# Patient Record
Sex: Female | Born: 1970 | Hispanic: Yes | Marital: Married | Smoking: Never smoker
Health system: Southern US, Community
[De-identification: ages and names within clinical notes are randomized; demographics above are authoritative.]

## PROBLEM LIST (undated history)

## (undated) DIAGNOSIS — D649 Anemia, unspecified: Secondary | ICD-10-CM

## (undated) HISTORY — DX: Anemia, unspecified: D64.9

---

## 1999-07-18 ENCOUNTER — Inpatient Hospital Stay (HOSPITAL_BASED_OUTPATIENT_CLINIC_OR_DEPARTMENT_OTHER)
Admission: EM | Admit: 1999-07-18 | Disposition: A | Discharge status: Home / Self Care | Payer: Self-pay | Source: Ambulatory Visit

## 2010-05-03 ENCOUNTER — Emergency Department: Admit: 2010-05-03 | Payer: Self-pay | Source: Emergency Department | Admitting: Emergency Medicine

## 2010-05-03 LAB — URINALYSIS, REFLEX TO MICROSCOPIC EXAM IF INDICATED
Bilirubin, UA: NEGATIVE
Glucose, UA: 50 — AB
Ketones UA: NEGATIVE
Leukocyte Esterase, UA: NEGATIVE
Nitrite, UA: NEGATIVE
Protein, UR: >300
Specific Gravity UA POCT: 1.005 (ref 1.001–1.035)
Urine pH: 7 (ref 5.0–8.0)
Urobilinogen, UA: NORMAL mg/dL

## 2010-05-03 LAB — PT AND APTT F/C
PT INR: 1.1 (ref 0.9–1.1)
PT: 14 s (ref 12.6–15.0)
PTT: 30 s (ref 23–37)

## 2010-05-03 LAB — CBC AND DIFFERENTIAL
Baso(Absolute): 0.01 10*3/uL (ref 0.00–0.20)
Basophils: 0 % (ref 0–2)
Eosinophils Absolute: 0.13 10*3/uL (ref 0.00–0.70)
Eosinophils: 2 % (ref 0–5)
Hematocrit: 36.3 % — ABNORMAL LOW (ref 37.0–47.0)
Hgb: 12.3 g/dL (ref 12.0–16.0)
Immature Granulocytes Absolute: 0.02 10*3/uL
Immature Granulocytes: 0 % (ref 0–1)
Lymphocytes Absolute: 2.16 10*3/uL (ref 0.50–4.40)
Lymphocytes: 29 % (ref 15–41)
MCH: 29.6 pg (ref 28.0–32.0)
MCHC: 33.9 g/dL (ref 32.0–36.0)
MCV: 87.5 fL (ref 80.0–100.0)
MPV: 10.4 fL (ref 9.4–12.3)
Monocytes Absolute: 0.25 10*3/uL (ref 0.00–1.20)
Monocytes: 3 % (ref 0–11)
Neutrophils Absolute: 4.86 10*3/uL
Neutrophils: 65 % (ref 52–75)
Platelets: 294 10*3/uL (ref 140–400)
RBC: 4.15 10*6/uL — ABNORMAL LOW (ref 4.20–5.40)
RDW: 13 % (ref 12–15)
WBC: 7.43 10*3/uL (ref 3.50–10.80)

## 2010-05-03 LAB — BASIC METABOLIC PANEL
BUN: 14 mg/dL (ref 8–20)
CO2: 25 mEq/L (ref 21–30)
Calcium: 8 mg/dL — ABNORMAL LOW (ref 8.6–10.2)
Chloride: 111 mEq/L — ABNORMAL HIGH (ref 98–107)
Creatinine: 0.7 mg/dL (ref 0.6–1.5)
Glucose: 82 mg/dL (ref 70–100)
Potassium: 4.3 mEq/L (ref 3.6–5.0)
Sodium: 139 mEq/L (ref 136–146)

## 2010-05-03 LAB — GFR: EGFR: >60

## 2010-05-12 ENCOUNTER — Ambulatory Visit (HOSPITAL_BASED_OUTPATIENT_CLINIC_OR_DEPARTMENT_OTHER)
Admission: RE | Admit: 2010-05-12 | Disposition: A | Discharge status: Home / Self Care | Payer: Self-pay | Source: Ambulatory Visit | Admitting: Obstetrics and Gynecology

## 2010-05-12 ENCOUNTER — Ambulatory Visit: Payer: Self-pay

## 2011-09-18 NOTE — Op Note (Signed)
Christie Odom, Christie Odom      MRN:          [id number]      Account:      [id number]      Document ID:  [id number]      Procedure Date: 05/12/2010            Admit Date: 05/12/2010            Patient Location: [id number]      Patient Type: A            SURGEON: Jearld Lesch Loda Bialas MD      ASSISTANT:                  PREOPERATIVE DIAGNOSIS:      Abnormal uterine bleeding, fibroid uterus.            POSTOPERATIVE DIAGNOSIS:      Abnormal uterine bleeding, fibroid uterus.            TITLE OF PROCEDURE:      Hysteroscopy, D and C.            ANESTHESIA:      General.            ESTIMATED BLOOD LOSS:      Less than 10 mL.            DESCRIPTION OF PROCEDURE:      The patient was taken to the operating room, was given general anesthesia.      The patient was prepped and draped in the usual sterile fashion.  Direct      examination reveals the uterus to be approximately 6 weeks size.  Cervical      os was open.  The weighted speculum was placed and the anterior lip of the      cervix was grasped with long Allis forceps.  The hysteroscope was inserted      using saline solution.  There was a copious amount of old blood in the      uterus, and endometrial tissue.  The hysteroscope was removed.  It was      followed by insertion of the polyp forceps and then sharp curettage was      performed obtaining __small___ amount of old blood and endometrial tissue.  The      hysteroscope was reinserted.  The uterine cavity appears to be clean, with      no significant abnormalities.  The procedure was terminated.  The patient      was transferred to the recovery room in stable condition.                                    D:  05/12/2010 08:37 AM by Dr. Jearld Lesch. Oziel Beitler, MD [phone])      T:  05/12/2010 10:43 AM by [id number]                                         Page 1 of 2      Christie Odom, Christie Odom      MRN:          [id number]      Account:      [id number]      Document ID:  [id number]      Procedure Date: 05/12/2010  cc:                                    Page 2 of 2      Authenticated and Edited by Andree Coss, MD On 05/13/10 8:02:54 AM

## 2017-06-25 IMAGING — US US NON OB TRANSVAGINAL W LTD TA
1 series · 14 of 28 positions shown · non-contrast
Comparison: None at all

Ultrasound pelvis
INDICATION: Pelvic pain

[Series 1: us non ob transvaginal w ltd ta · 0.26mm/px · 14 of 28 slices shown]
[im 2/28]
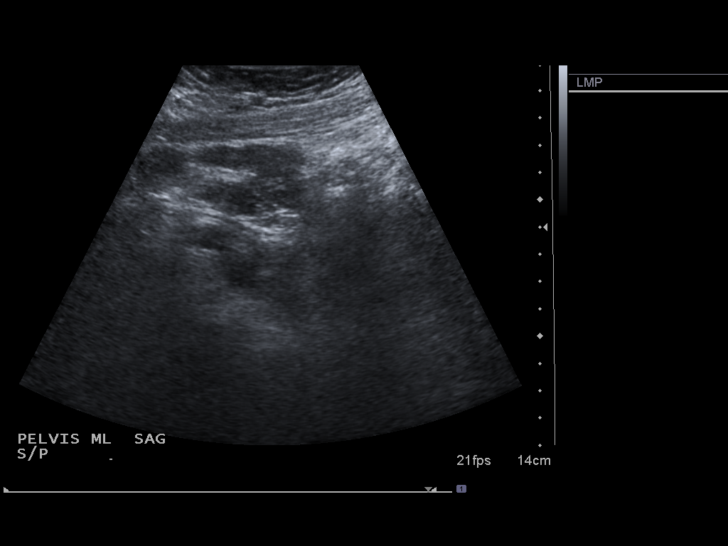
[im 4/28]
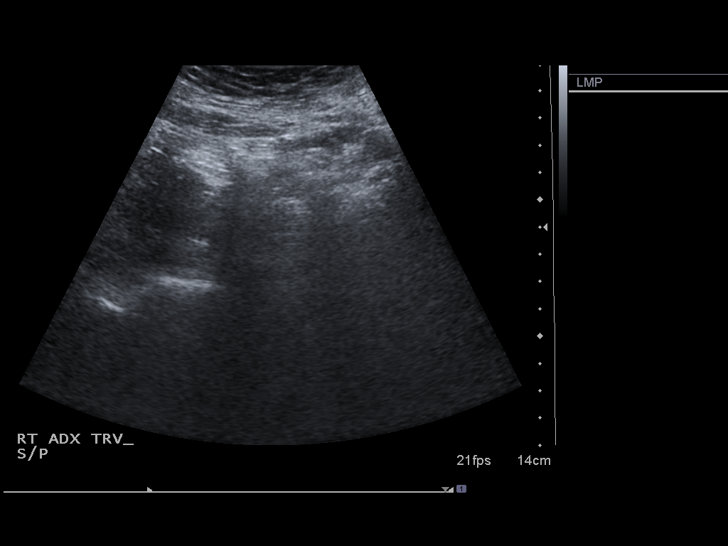
[im 6/28]
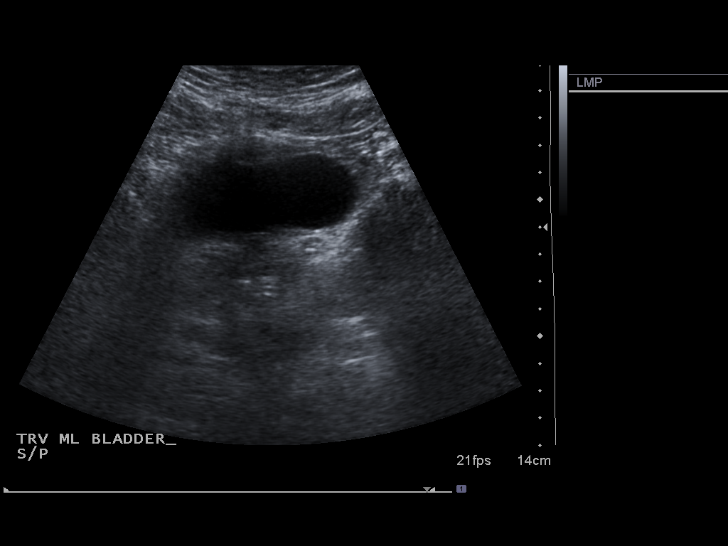
[im 8/28]
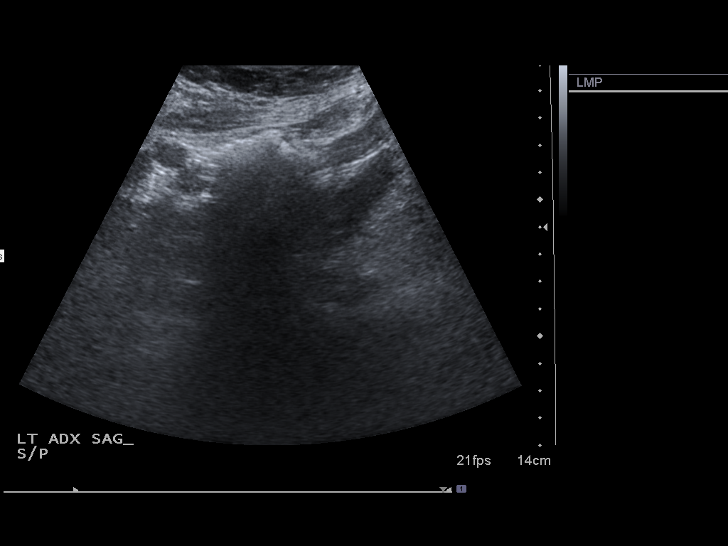
[im 10/28]
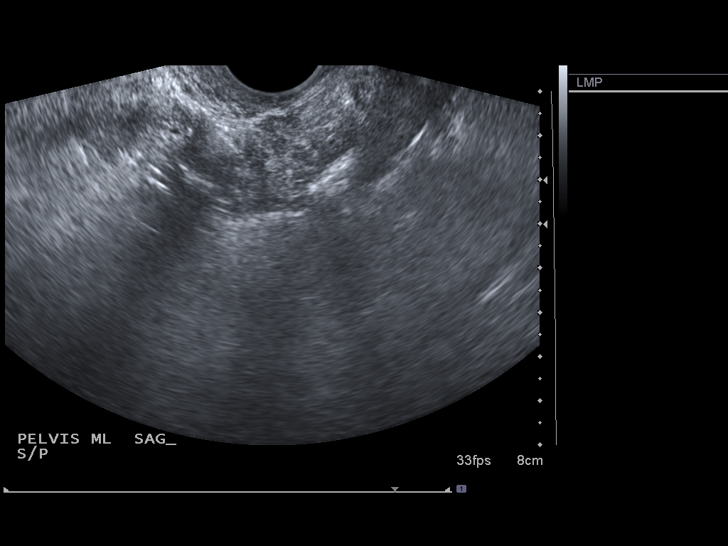
[im 12/28]
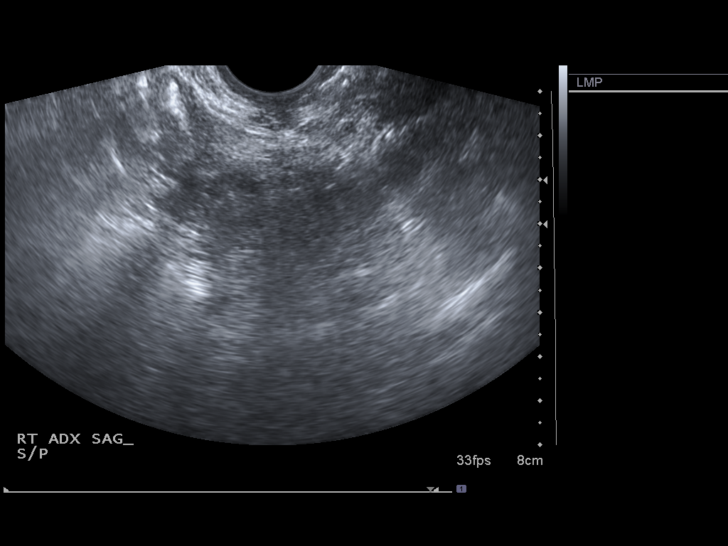
[im 14/28]
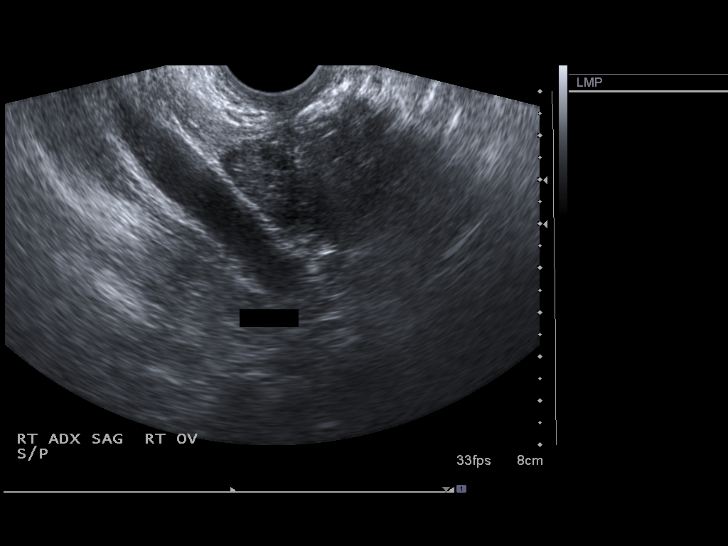
[im 16/28]
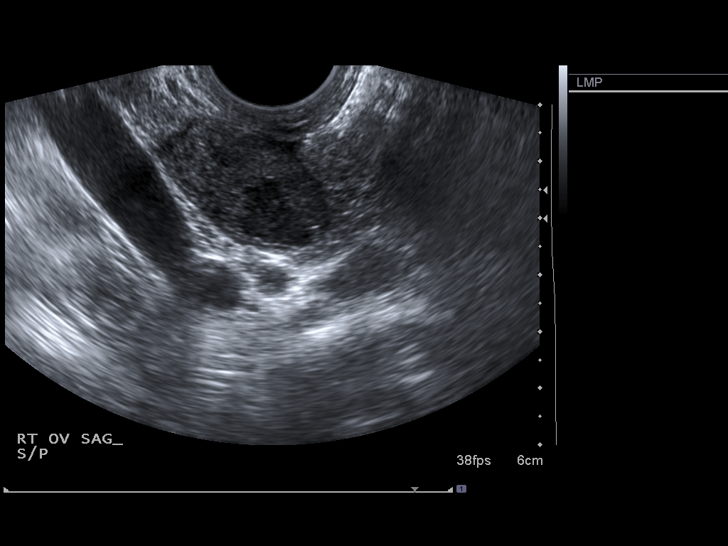
[im 18/28]
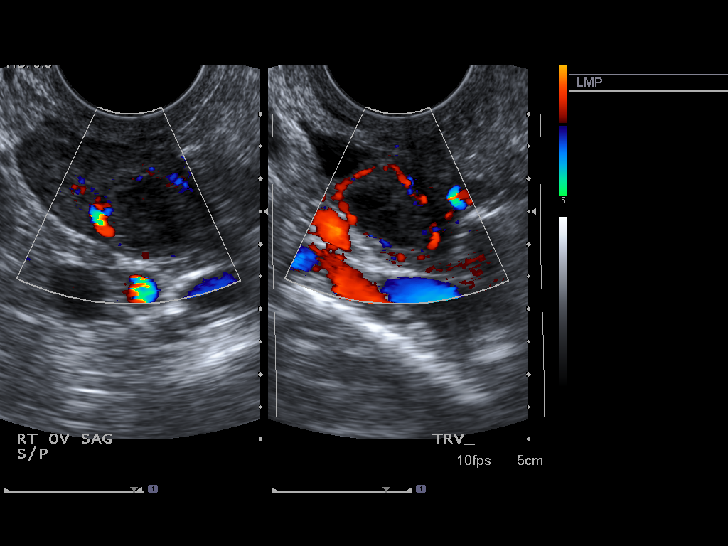
[im 20/28]
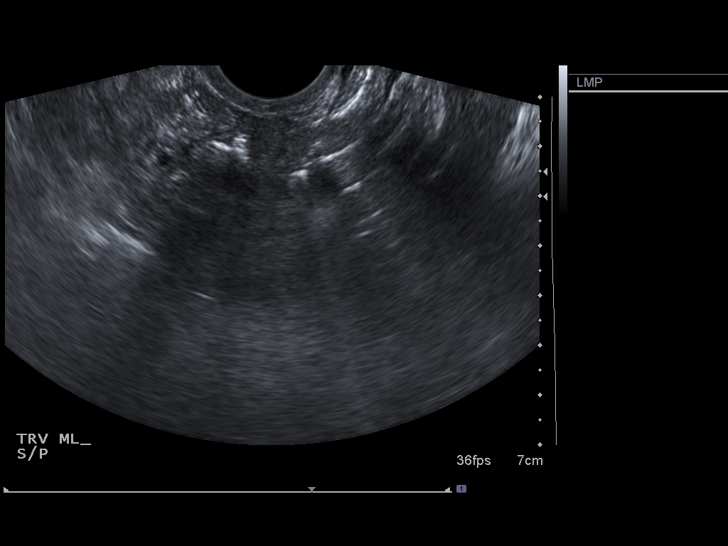
[im 22/28]
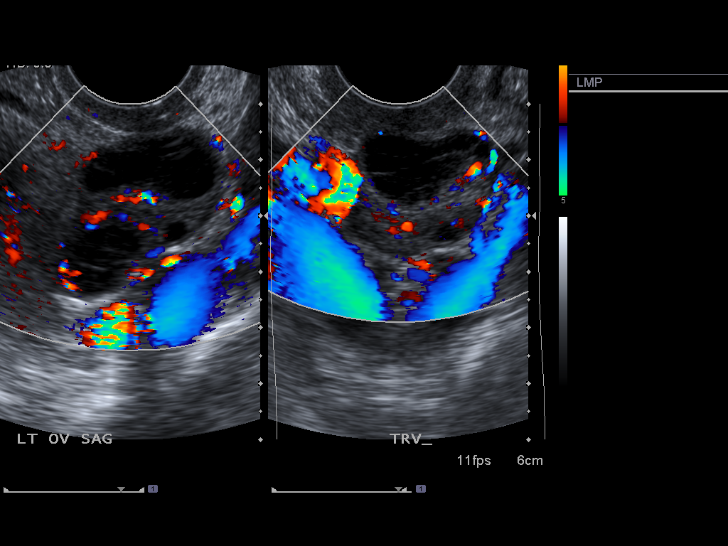
[im 24/28]
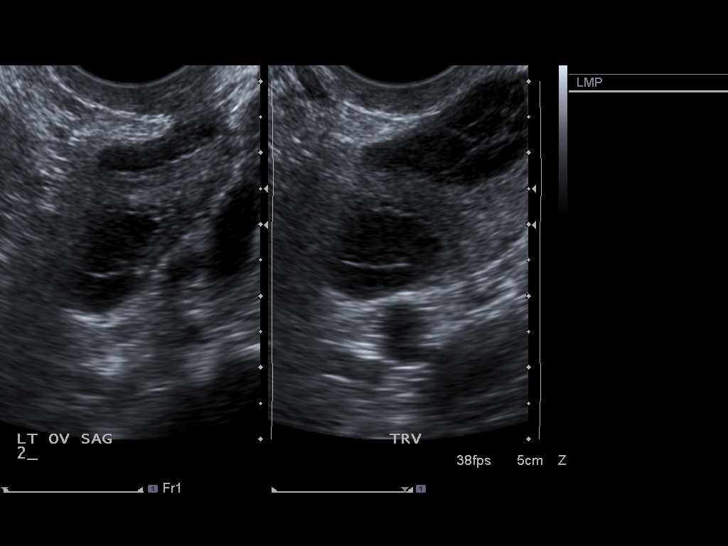
[im 26/28]
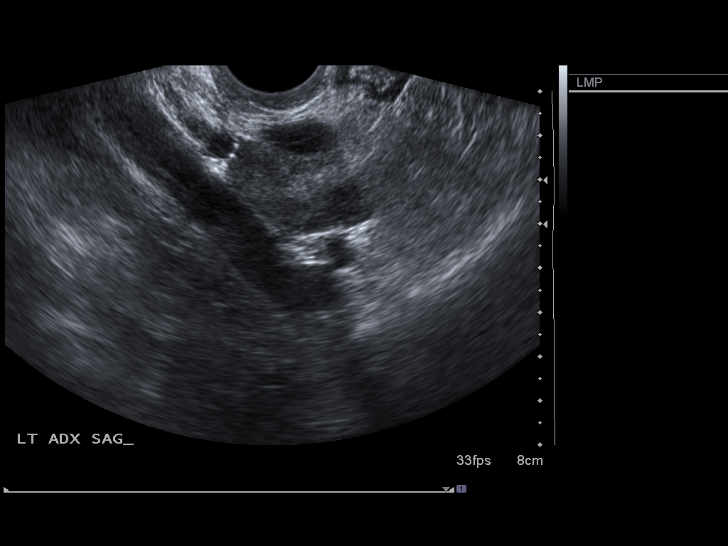
[im 28/28]
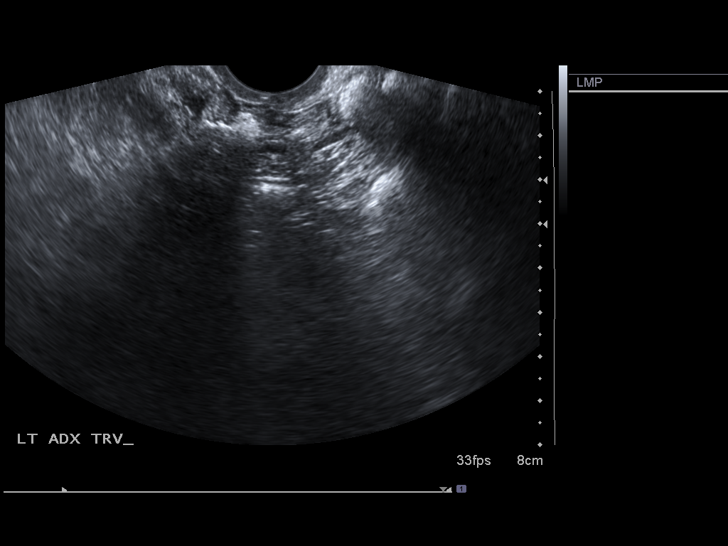

[14 of 28 positions shown; findings below may reference images not displayed]

FINDINGS: Transabdominal and endovaginal scanning shows post hysterectomy.

The right ovary is 3.2 x 1.9 x 2.6 cm with multiple follicles measuring up to 15 mm.

Left ovary is 4.0 x 2.3 x 2.9 cm with a partially complex cyst at 2.4 cm in greatest dimension.

Limited Doppler imaging of the ovaries show internal flow.

I see no free fluid in the cul-de-sac.
IMPRESSION: 2.4 cm left complex ovarian cyst. Bilateral follicles. Hysterectomy.

## 2017-11-30 DIAGNOSIS — D259 Leiomyoma of uterus, unspecified: Secondary | ICD-10-CM

## 2017-11-30 DIAGNOSIS — N920 Excessive and frequent menstruation with regular cycle: Secondary | ICD-10-CM

## 2017-11-30 HISTORY — DX: Leiomyoma of uterus, unspecified: D25.9

## 2017-11-30 HISTORY — DX: Excessive and frequent menstruation with regular cycle: N92.0

## 2018-01-08 ENCOUNTER — Ambulatory Visit: Payer: Self-pay

## 2018-01-08 ENCOUNTER — Ambulatory Visit: Payer: BC Managed Care – PPO | Attending: Obstetrics & Gynecology

## 2018-01-08 ENCOUNTER — Encounter (HOSPITAL_BASED_OUTPATIENT_CLINIC_OR_DEPARTMENT_OTHER): Payer: Self-pay

## 2018-01-08 DIAGNOSIS — Z01818 Encounter for other preprocedural examination: Secondary | ICD-10-CM

## 2018-01-08 LAB — TYPE AND SCREEN
AB Screen Gel: NEGATIVE
ABO Rh: O POS

## 2018-01-08 LAB — CBC AND DIFFERENTIAL
Absolute NRBC: 0 10*3/uL
Basophils Absolute Automated: 0.02 10*3/uL (ref 0.00–0.20)
Basophils Automated: 0.3 %
Eosinophils Absolute Automated: 0.21 10*3/uL (ref 0.00–0.70)
Eosinophils Automated: 3.1 %
Hematocrit: 41.7 % (ref 37.0–47.0)
Hgb: 12.5 g/dL (ref 12.0–16.0)
Immature Granulocytes Absolute: 0.02 10*3/uL
Immature Granulocytes: 0.3 %
Lymphocytes Absolute Automated: 1.53 10*3/uL (ref 0.50–4.40)
Lymphocytes Automated: 22.9 %
MCH: 24.5 pg — ABNORMAL LOW (ref 28.0–32.0)
MCHC: 30 g/dL — ABNORMAL LOW (ref 32.0–36.0)
MCV: 81.8 fL (ref 80.0–100.0)
MPV: 9.9 fL (ref 9.4–12.3)
Monocytes Absolute Automated: 0.36 10*3/uL (ref 0.00–1.20)
Monocytes: 5.4 %
Neutrophils Absolute: 4.53 10*3/uL (ref 1.80–8.10)
Neutrophils: 68 %
Nucleated RBC: 0 /100 WBC (ref 0.0–1.0)
Platelets: 298 10*3/uL (ref 140–400)
RBC: 5.1 10*6/uL (ref 4.20–5.40)
RDW: 26 % — ABNORMAL HIGH (ref 12–15)
WBC: 6.67 10*3/uL (ref 3.50–10.80)

## 2018-01-08 LAB — CELL MORPHOLOGY
Cell Morphology: ABNORMAL — AB
Platelet Estimate: NORMAL

## 2018-01-08 NOTE — Pre-Procedure Instructions (Signed)
Interview completed.  Pt to come to [facility] today 01/08/18 for cbc, T&S      Attn lab: cbc, t&s orders in epic

## 2018-01-14 ENCOUNTER — Encounter (HOSPITAL_BASED_OUTPATIENT_CLINIC_OR_DEPARTMENT_OTHER): Payer: Self-pay

## 2018-01-14 ENCOUNTER — Ambulatory Visit
Admission: RE | Admit: 2018-01-14 | Discharge: 2018-01-15 | Disposition: A | Discharge status: Home / Self Care | Payer: BC Managed Care – PPO | Source: Ambulatory Visit | Attending: Obstetrics & Gynecology | Admitting: Obstetrics & Gynecology

## 2018-01-14 ENCOUNTER — Ambulatory Visit (HOSPITAL_BASED_OUTPATIENT_CLINIC_OR_DEPARTMENT_OTHER): Payer: BC Managed Care – PPO | Admitting: Certified Registered Nurse Anesthetist

## 2018-01-14 ENCOUNTER — Encounter (HOSPITAL_BASED_OUTPATIENT_CLINIC_OR_DEPARTMENT_OTHER)
Admission: RE | Disposition: A | Discharge status: Home / Self Care | Payer: Self-pay | Source: Ambulatory Visit | Attending: Obstetrics & Gynecology

## 2018-01-14 DIAGNOSIS — D259 Leiomyoma of uterus, unspecified: Secondary | ICD-10-CM | POA: Diagnosis present

## 2018-01-14 DIAGNOSIS — N8300 Follicular cyst of ovary, unspecified side: Secondary | ICD-10-CM

## 2018-01-14 DIAGNOSIS — N939 Abnormal uterine and vaginal bleeding, unspecified: Secondary | ICD-10-CM | POA: Diagnosis present

## 2018-01-14 DIAGNOSIS — D251 Intramural leiomyoma of uterus: Secondary | ICD-10-CM | POA: Insufficient documentation

## 2018-01-14 DIAGNOSIS — D649 Anemia, unspecified: Secondary | ICD-10-CM | POA: Insufficient documentation

## 2018-01-14 DIAGNOSIS — N838 Other noninflammatory disorders of ovary, fallopian tube and broad ligament: Secondary | ICD-10-CM | POA: Insufficient documentation

## 2018-01-14 DIAGNOSIS — N8301 Follicular cyst of right ovary: Secondary | ICD-10-CM | POA: Insufficient documentation

## 2018-01-14 DIAGNOSIS — N800 Endometriosis of uterus: Secondary | ICD-10-CM | POA: Insufficient documentation

## 2018-01-14 DIAGNOSIS — N8302 Follicular cyst of left ovary: Secondary | ICD-10-CM | POA: Insufficient documentation

## 2018-01-14 HISTORY — PX: CYSTOSCOPY: SHX3552

## 2018-01-14 HISTORY — PX: LAPAROSCOPIC, VAGINAL HYSTERECTOMY: SHX4576

## 2018-01-14 LAB — POCT PREGNANCY TEST, URINE HCG: POCT Pregnancy HCG Test, UR: NEGATIVE

## 2018-01-14 SURGERY — LAPAROSCOPIC, VAGINAL HYSTERECTOMY
Anesthesia: Anesthesia General | Site: Bladder | Wound class: Clean Contaminated

## 2018-01-14 MED ORDER — ONDANSETRON 4 MG PO TBDP
4.0000 mg | ORAL_TABLET | Freq: Three times a day (TID) | ORAL | Status: DC | PRN
Start: 2018-01-14 — End: 2018-01-15

## 2018-01-14 MED ORDER — FENTANYL CITRATE (PF) 50 MCG/ML IJ SOLN (WRAP)
INTRAMUSCULAR | Status: AC
Start: 2018-01-14 — End: ?
  Filled 2018-01-14: qty 2

## 2018-01-14 MED ORDER — KETOROLAC TROMETHAMINE 30 MG/ML IJ SOLN
30.00 mg | Freq: Four times a day (QID) | INTRAMUSCULAR | Status: AC
Start: 2018-01-14 — End: 2018-01-15
  Administered 2018-01-14 – 2018-01-15 (×3): 30 mg via INTRAVENOUS
  Filled 2018-01-14 (×3): qty 1

## 2018-01-14 MED ORDER — PROPOFOL 10 MG/ML IV EMUL (WRAP)
INTRAVENOUS | Status: AC
Start: 2018-01-14 — End: ?
  Filled 2018-01-14: qty 20

## 2018-01-14 MED ORDER — BUPIVACAINE HCL (PF) 0.25 % IJ SOLN
INTRAMUSCULAR | Status: AC
Start: 2018-01-14 — End: ?
  Filled 2018-01-14: qty 10

## 2018-01-14 MED ORDER — HYDROMORPHONE HCL 1 MG/ML IJ SOLN
INTRAMUSCULAR | Status: AC
Start: 2018-01-14 — End: ?
  Filled 2018-01-14: qty 1

## 2018-01-14 MED ORDER — LIDOCAINE HCL 2 % IJ SOLN
INTRAMUSCULAR | Status: DC | PRN
Start: 2018-01-14 — End: 2018-01-14
  Administered 2018-01-14: 100 mg via INTRAVENOUS

## 2018-01-14 MED ORDER — GLYCOPYRROLATE 0.2 MG/ML IJ SOLN
INTRAMUSCULAR | Status: DC | PRN
Start: 2018-01-14 — End: 2018-01-14
  Administered 2018-01-14: .6 mg via INTRAVENOUS
  Administered 2018-01-14: 0.1 mg via INTRAVENOUS

## 2018-01-14 MED ORDER — FLUORESCEIN SODIUM 10 % IV SOLN
INTRAVENOUS | Status: DC | PRN
Start: 2018-01-14 — End: 2018-01-14
  Administered 2018-01-14: 10 mg via INTRAVENOUS

## 2018-01-14 MED ORDER — ONDANSETRON HCL 4 MG/2ML IJ SOLN
INTRAMUSCULAR | Status: AC
Start: 2018-01-14 — End: ?
  Filled 2018-01-14: qty 2

## 2018-01-14 MED ORDER — CEFAZOLIN SODIUM-DEXTROSE 2-3 GM-%(50ML) IV SOLR
2.0000 g | Freq: Once | INTRAVENOUS | Status: AC
Start: 2018-01-14 — End: 2018-01-14
  Administered 2018-01-14: 2 g via INTRAVENOUS

## 2018-01-14 MED ORDER — IBUPROFEN 600 MG PO TABS
600.0000 mg | ORAL_TABLET | Freq: Four times a day (QID) | ORAL | Status: DC
Start: 2018-01-15 — End: 2018-01-15
  Administered 2018-01-15: 08:00:00 600 mg via ORAL
  Filled 2018-01-14: qty 1

## 2018-01-14 MED ORDER — SENNOSIDES-DOCUSATE SODIUM 8.6-50 MG PO TABS
1.0000 | ORAL_TABLET | Freq: Every evening | ORAL | Status: DC | PRN
Start: 2018-01-14 — End: 2018-01-15

## 2018-01-14 MED ORDER — NALOXONE HCL 0.4 MG/ML IJ SOLN (WRAP)
0.2000 mg | INTRAMUSCULAR | Status: DC | PRN
Start: 2018-01-14 — End: 2018-01-15

## 2018-01-14 MED ORDER — LACTATED RINGERS IV SOLN
INTRAVENOUS | Status: DC
Start: 2018-01-14 — End: 2018-01-14

## 2018-01-14 MED ORDER — FENTANYL CITRATE (PF) 50 MCG/ML IJ SOLN (WRAP)
INTRAMUSCULAR | Status: DC | PRN
Start: 2018-01-14 — End: 2018-01-14
  Administered 2018-01-14 (×2): 50 ug via INTRAVENOUS

## 2018-01-14 MED ORDER — HYDROMORPHONE HCL 0.5 MG/0.5 ML IJ SOLN
0.5000 mg | INTRAMUSCULAR | Status: DC | PRN
Start: 2018-01-14 — End: 2018-01-14
  Administered 2018-01-14 (×2): 0.5 mg via INTRAVENOUS

## 2018-01-14 MED ORDER — MIDAZOLAM HCL 2 MG/2ML IJ SOLN
INTRAMUSCULAR | Status: AC
Start: 2018-01-14 — End: ?
  Filled 2018-01-14: qty 2

## 2018-01-14 MED ORDER — DOCUSATE SODIUM 100 MG PO CAPS
200.0000 mg | ORAL_CAPSULE | Freq: Two times a day (BID) | ORAL | Status: DC
Start: 2018-01-15 — End: 2018-01-15
  Administered 2018-01-15: 10:00:00 200 mg via ORAL
  Filled 2018-01-14: qty 2

## 2018-01-14 MED ORDER — SODIUM CHLORIDE 0.9% BAG (IRRIGATION USE)
INTRAVENOUS | Status: DC | PRN
Start: 2018-01-14 — End: 2018-01-14
  Administered 2018-01-14: 1000 mL
  Administered 2018-01-14: 450 mL

## 2018-01-14 MED ORDER — HYDROMORPHONE HCL 1 MG/ML IJ SOLN
1.00 mg | INTRAMUSCULAR | Status: AC | PRN
Start: 2018-01-14 — End: 2018-01-15
  Filled 2018-01-14: qty 1

## 2018-01-14 MED ORDER — EPHEDRINE SULFATE 50 MG/ML IJ/IV SOLN (WRAP)
Status: AC
Start: 2018-01-14 — End: ?
  Filled 2018-01-14: qty 1

## 2018-01-14 MED ORDER — SIMETHICONE 80 MG PO CHEW
80.00 mg | CHEWABLE_TABLET | Freq: Four times a day (QID) | ORAL | Status: DC | PRN
Start: 2018-01-15 — End: 2018-01-15

## 2018-01-14 MED ORDER — FAMOTIDINE 20 MG/2ML IV SOLN
INTRAVENOUS | Status: AC
Start: 2018-01-14 — End: ?
  Filled 2018-01-14: qty 2

## 2018-01-14 MED ORDER — ROCURONIUM BROMIDE 50 MG/5ML IV SOLN
INTRAVENOUS | Status: DC | PRN
Start: 2018-01-14 — End: 2018-01-14
  Administered 2018-01-14: 40 mg via INTRAVENOUS

## 2018-01-14 MED ORDER — NEOSTIGMINE METHYLSULFATE 1 MG/ML IJ/IV SOLN (WRAP)
Status: DC | PRN
Start: 2018-01-14 — End: 2018-01-14
  Administered 2018-01-14: 3 mg via INTRAVENOUS

## 2018-01-14 MED ORDER — OXYCODONE HCL 5 MG PO TABS
ORAL_TABLET | ORAL | Status: AC
Start: 2018-01-14 — End: ?
  Filled 2018-01-14: qty 1

## 2018-01-14 MED ORDER — LIDOCAINE HCL (PF) 2 % IJ SOLN
INTRAMUSCULAR | Status: AC
Start: 2018-01-14 — End: ?
  Filled 2018-01-14: qty 5

## 2018-01-14 MED ORDER — STERILE WATER FOR IRRIGATION IR SOLN
Status: DC | PRN
Start: 2018-01-14 — End: 2018-01-14
  Administered 2018-01-14: 1000 mL

## 2018-01-14 MED ORDER — BUPIVACAINE HCL 0.25 % IJ SOLN
INTRAMUSCULAR | Status: DC | PRN
Start: 2018-01-14 — End: 2018-01-14
  Administered 2018-01-14: 8 mL

## 2018-01-14 MED ORDER — SODIUM CHLORIDE 0.9 % IJ SOLN
INTRAMUSCULAR | Status: AC
Start: 2018-01-14 — End: ?
  Filled 2018-01-14: qty 10

## 2018-01-14 MED ORDER — ONDANSETRON HCL 4 MG/2ML IJ SOLN
4.0000 mg | Freq: Three times a day (TID) | INTRAMUSCULAR | Status: DC | PRN
Start: 2018-01-14 — End: 2018-01-15

## 2018-01-14 MED ORDER — DEXAMETHASONE SODIUM PHOSPHATE 20 MG/5ML IJ SOLN
INTRAMUSCULAR | Status: AC
Start: 2018-01-14 — End: ?
  Filled 2018-01-14: qty 5

## 2018-01-14 MED ORDER — PROMETHAZINE HCL 25 MG/ML IJ SOLN
6.2500 mg | Freq: Once | INTRAMUSCULAR | Status: DC | PRN
Start: 2018-01-14 — End: 2018-01-14

## 2018-01-14 MED ORDER — ROCURONIUM BROMIDE 50 MG/5ML IV SOLN
INTRAVENOUS | Status: AC
Start: 2018-01-14 — End: ?
  Filled 2018-01-14: qty 5

## 2018-01-14 MED ORDER — FLUORESCEIN SODIUM 10 % IV SOLN
INTRAVENOUS | Status: AC
Start: 2018-01-14 — End: ?
  Filled 2018-01-14: qty 5

## 2018-01-14 MED ORDER — ACETAMINOPHEN 500 MG PO TABS
1000.0000 mg | ORAL_TABLET | Freq: Once | ORAL | Status: AC
Start: 2018-01-14 — End: 2018-01-14
  Administered 2018-01-14: 09:00:00 1000 mg via ORAL

## 2018-01-14 MED ORDER — GLYCOPYRROLATE 0.2 MG/ML IJ SOLN
INTRAMUSCULAR | Status: AC
Start: 2018-01-14 — End: ?
  Filled 2018-01-14: qty 2

## 2018-01-14 MED ORDER — FENTANYL CITRATE (PF) 50 MCG/ML IJ SOLN (WRAP)
25.0000 ug | INTRAMUSCULAR | Status: AC | PRN
Start: 2018-01-14 — End: 2018-01-14
  Administered 2018-01-14 (×4): 25 ug via INTRAVENOUS
  Filled 2018-01-14: qty 2

## 2018-01-14 MED ORDER — LACTATED RINGERS IV SOLN
50.0000 mL/h | INTRAVENOUS | Status: DC
Start: 2018-01-14 — End: 2018-01-15
  Administered 2018-01-14: 50 mL/h via INTRAVENOUS

## 2018-01-14 MED ORDER — OXYCODONE-ACETAMINOPHEN 5-325 MG PO TABS
1.0000 | ORAL_TABLET | ORAL | Status: DC | PRN
Start: 2018-01-15 — End: 2018-01-15
  Administered 2018-01-15: 1 via ORAL
  Filled 2018-01-14: qty 1

## 2018-01-14 MED ORDER — MIDAZOLAM HCL 2 MG/2ML IJ SOLN
INTRAMUSCULAR | Status: DC | PRN
Start: 2018-01-14 — End: 2018-01-14
  Administered 2018-01-14: 2 mg via INTRAVENOUS

## 2018-01-14 MED ORDER — ONDANSETRON HCL 4 MG/2ML IJ SOLN
4.0000 mg | Freq: Once | INTRAMUSCULAR | Status: DC | PRN
Start: 2018-01-14 — End: 2018-01-14

## 2018-01-14 MED ORDER — DEXAMETHASONE SODIUM PHOSPHATE 4 MG/ML IJ SOLN (WRAP)
INTRAMUSCULAR | Status: DC | PRN
Start: 2018-01-14 — End: 2018-01-14
  Administered 2018-01-14: 4 mg via INTRAVENOUS

## 2018-01-14 MED ORDER — OXYCODONE HCL 5 MG PO TABS
5.0000 mg | ORAL_TABLET | Freq: Once | ORAL | Status: AC | PRN
Start: 2018-01-14 — End: 2018-01-14
  Administered 2018-01-14: 5 mg via ORAL
  Filled 2018-01-14: qty 1

## 2018-01-14 MED ORDER — FAMOTIDINE 10 MG/ML IV SOLN (WRAP)
INTRAVENOUS | Status: DC | PRN
Start: 2018-01-14 — End: 2018-01-14
  Administered 2018-01-14: 20 mg via INTRAVENOUS

## 2018-01-14 MED ORDER — MEPERIDINE HCL 25 MG/ML IJ SOLN
12.5000 mg | INTRAMUSCULAR | Status: DC | PRN
Start: 2018-01-14 — End: 2018-01-14
  Filled 2018-01-14: qty 1

## 2018-01-14 MED ORDER — PROMETHAZINE HCL 25 MG/ML IJ SOLN
INTRAMUSCULAR | Status: AC
Start: 2018-01-14 — End: ?
  Filled 2018-01-14: qty 1

## 2018-01-14 MED ORDER — NEOSTIGMINE METHYLSULFATE 1 MG/ML IJ/IV SOLN (WRAP)
Status: AC
Start: 2018-01-14 — End: ?
  Filled 2018-01-14: qty 5

## 2018-01-14 MED ORDER — ONDANSETRON HCL 4 MG/2ML IJ SOLN
INTRAMUSCULAR | Status: DC | PRN
Start: 2018-01-14 — End: 2018-01-14
  Administered 2018-01-14 (×2): 4 mg via INTRAVENOUS

## 2018-01-14 MED ORDER — ACETAMINOPHEN 325 MG PO TABS
325.0000 mg | ORAL_TABLET | Freq: Once | ORAL | Status: DC | PRN
Start: 2018-01-14 — End: 2018-01-14

## 2018-01-14 MED ORDER — PROPOFOL 10 MG/ML IV EMUL (WRAP)
INTRAVENOUS | Status: DC | PRN
Start: 2018-01-14 — End: 2018-01-14
  Administered 2018-01-14: 50 mg via INTRAVENOUS
  Administered 2018-01-14: 150 mg via INTRAVENOUS

## 2018-01-14 MED ORDER — MAGNESIUM HYDROXIDE 400 MG/5ML PO SUSP
30.0000 mL | Freq: Four times a day (QID) | ORAL | Status: DC | PRN
Start: 2018-01-14 — End: 2018-01-15

## 2018-01-14 MED ORDER — BISACODYL 10 MG RE SUPP
10.0000 mg | Freq: Every day | RECTAL | Status: DC | PRN
Start: 2018-01-14 — End: 2018-01-15

## 2018-01-14 SURGICAL SUPPLY — 56 items
APPLCATOR CHLORAPREP 26ML (Prep) ×3 IMPLANT
BAG ENDO POUCH (Procedure Accessories) IMPLANT
BAND AID STERILE 1X3 (Dressing) ×3 IMPLANT
BANDAGE STERI-STRIP 0.5X4IN (Dressing) ×1
CANULA STABILITY 5MM (Procedure Accessories) ×3 IMPLANT
DEVICE CLSR 0 GS-21 V-LOC 180 9IN TPR (Suture) ×2
DEVICE V-LOC 180 W/GS-21 NDLE (Suture) ×1
ELECTRODE ADULT PATIENT RETURN L9 FT REM POLYHESIVE ACRYLIC FOAM (Procedure Accessories) ×2 IMPLANT
ELECTRODE PATIENT RETURN L9 FT VALLEYLAB (Procedure Accessories) ×2 IMPLANT
ELECTRODE PT RTN RM PHSV ACRL FM C30- LB (Procedure Accessories) ×2
GLOVE SURG BIOGEL SZ7.5 (Glove) ×3 IMPLANT
IRRIGATOR SUCTION ERGONOMIC HAND PIECE STRYKEFLOW II (Suction) IMPLANT
IRRIGATOR SUCTION STRYKEFLOW 2 (Suction) IMPLANT
IRRIGATOR SUCTN PUMP/HANDPIECE (Suction)
KIT ADV LAPSCPC CARE (Kits)
KIT CLOSURE PROCEDURE (Suture) IMPLANT
KIT PSTN 1STP PNK PD NS 1 LFT SHT 1 STP (Positioning Supplies) ×2 IMPLANT
NEEDLE INJ SFTY 22GX1.5IN (Needles) ×3 IMPLANT
NEEDLE INSUFFLATION 120MM (Needles) ×3 IMPLANT
NEEDLE REG BEVEL 19GX1.5IN (Needles) ×3 IMPLANT
PAD ELECTROSRG GRND REM W CRD (Procedure Accessories) ×1
PAD SANITARY L12.25 IN X W4.25 IN HEAVY ABSORBENT MOISTURE BARRIER (Dressing) ×2 IMPLANT
PAD SNTR SLK FLF CRTY 12.25X4.25IN LF NS (Dressing) ×2 IMPLANT
PAD-PERI SANITARY REGULAR (Dressing) ×1
POSITIONING PAD PINK (Positioning Supplies) ×1
SCISSOR ENDOCUT DISP LAPO (Instrument) IMPLANT
SEALER DIVIDER LIGASURE BLUNT (Procedure Accessories)
SEALER/DIVIDER LAPAROSCOPIC L37 CM 180 D (Procedure Accessories) IMPLANT
SEALER/DIVIDER LAPAROSCOPIC L37 CM 180 D L17.8 MM 2 ACTION JAW BLUNT (Procedure Accessories) IMPLANT
SEALER/DIVIDER LAPSCP 180D 17.8MM LGSR 5 (Procedure Accessories)
SOL NACL .9% 1000ML LATEX (IV Solutions) ×1
SOL NACL.9% 1000ML IRR NONLTX (Irrigation Solutions) ×1
SOLUTION IRR 0.9% NACL 1000ML LF STRL (Irrigation Solutions) ×2
SOLUTION IRRIGATION 0.9% SODIUM CHLORIDE (Irrigation Solutions) ×2 IMPLANT
SOLUTION IRRIGATION 0.9% SODIUM CHLORIDE 1000 ML PLASTIC POUR BOTTLE (Irrigation Solutions) ×2 IMPLANT
SOLUTION IV 0.9% NACL 1000ML VFLX LF PLS (IV Solutions) ×2
SOLUTION IV 0.9% SODIUM CHLORIDE PVC (IV Solutions) ×2 IMPLANT
SOLUTION IV 0.9% SODIUM CHLORIDE PVC 1000 ML PH 5 PLASTIC CONTAINER (IV Solutions) ×2 IMPLANT
STRIP SKIN CLOSURE L4 IN X W1/2 IN (Dressing) ×2 IMPLANT
STRIP SKIN CLOSURE L4 IN X W1/2 IN REINFORCE STERI-STRIP POLYESTER (Dressing) ×2 IMPLANT
STRIP SKNCLS PLSTR STRSTRP 4X.5IN LF (Dressing) ×2
SUTURE MONOCRYL 4-0 PS2 27IN (Suture) ×3 IMPLANT
SUTURE V-LOC 180 0 GS-21 1/2 CIRCLE L9 IN ABS GREEN (Suture) ×2 IMPLANT
SUTURE V-LOC 180 0 GS-21 L9 IN TAPER (Suture) ×2 IMPLANT
SUTURE VICRYL 012X18IN (Suture) IMPLANT
SYSTEM IMAGING 8X6IN CLEARIFY MICROFIBER WARM HUB TRCR WIPE DSPSBL (Kits) IMPLANT
SYSTEM IMG MRFBR CLEARIFY 8X6IN WRM HUB (Kits) IMPLANT
TOWEL STERILE REUSABLE 8PK (Procedure Accessories) ×3 IMPLANT
TRAY LAPAROSCOPY GYN (Pack) ×3 IMPLANT
TROCAR BLADELESS ENDO 5X100MM (Laparoscopy Supplies) ×3 IMPLANT
TROCAR ENDO BLADELESS 11X100MM (Laparoscopy Supplies) IMPLANT
TUBE SET DISP HIGH FLOW (Tubing) ×1
TUBING INSFL THRMPLST 45L PNEUMOSURE (Tubing) ×2
TUBING INSUFFLATION SET HIGH FLOW (Tubing) ×2 IMPLANT
TUBING INSUFFLATION SET HIGH FLOW TOUCHSCREEN PNEUMOSURE THERMOPLASTIC (Tubing) ×2 IMPLANT
TUBING SCT IRR (Suction)

## 2018-01-14 NOTE — H&P (Signed)
ADMISSION HISTORY AND PHYSICAL EXAM    Date Time: 01/14/18 8:51 AM  Patient Name: Christie Odom  Attending Physician: Kimber Relic, MD    Assessment:     FIbroid uterus    endometrial polyps    AUB    anemia    Plan:   LAVH with BSO    History of Present Illness:   Christie Odom is a 47 y.o. female who presents to the hospital with   C/o that she started bleeding heavily 5 yeas ago and started having heavy blood clots    Pt had D&C which helped for a little    Periods are 8 days long    at its its worst pt changes pads every hour.    Pt has been taking OCPs with some satisfaction    Pt now has severe pain and has noted uterus has brown like she is pregnant.     Pt wants definitive treatement.        pt does not premenopause symptoms    Past Medical History:     Past Medical History:   Diagnosis Date   . Anemia     related to heavy menses, takes po IRON   . Heavy menstrual period 2019   . Uterine fibroid 2019       Past Surgical History:   History reviewed. No pertinent surgical history.    Family History:   History reviewed. No pertinent family history.    Social History:     Social History     Social History   . Marital status: Married     Spouse name: N/A   . Number of children: N/A   . Years of education: N/A     Social History Main Topics   . Smoking status: Never Smoker   . Smokeless tobacco: Never Used   . Alcohol use No   . Drug use: No   . Sexual activity: Not on file     Other Topics Concern   . Not on file     Social History Narrative   . No narrative on file       Allergies:   No Known Allergies    Medications:     Prescriptions Prior to Admission   Medication Sig   . Cholecalciferol (VITAMIN D3) 50000 units Cap Take 1 tablet by mouth once a week.   . Fe Cbn-Fe Gluc-FA-B12-C-DSS (FERRALET 90 PO) Take 1 tablet by mouth daily.   . Norethin Ace-Eth Estrad-FE (LOESTRIN 24 FE PO) Take by mouth daily.       Review of Systems:   A comprehensive review of systems was: Negative except hpi    Physical  Exam:   There were no vitals filed for this visit.    Intake and Output Summary (Last 24 hours) at Date Time  No intake or output data in the 24 hours ending 01/14/18 0851    General appearance - alert, well appearing, and in no distress   Lungs: CTAB  Heart: RRR  Abd: soft/ND/NT, gravid  Ext: no calf tenderness  GU: deferred to OR    Labs:     Results     ** No results found for the last 24 hours. **          Recent CBC No results for input(s): RBC, HGB, HCT, MCV, MCH, MCHC, RDW, MPV, LABPLAT in the last 24 hours.    Invalid input(s): WHITEBLOODCE,  NRBCA,  REFLX,  ANRBA  Rads:   Radiological Procedure reviewed.    11/17/17  ULTRASOUND PELVIC COMPLETE AND TRANSVAGINAL:   The uterus is anteverted, measuring 14.1 cm in length, 7.7 com AP, and 9.2 cm transverse.   The largest is intramural fibriod in the left side of the fundus measuring 8.2 x 7.6 x 6.6 cm.    A smaller intramural fibrioid is seen in the right posterior body measuring 3.2 x 2.9 x 2.8 cm.     A relatively echogenic area seen in the medial cavity measuring about 14   mm in diameter suspicious for a polyp. Additional polyp are also   suspected.    Signed by: Kimber Relic

## 2018-01-14 NOTE — Transfer of Care (Signed)
Anesthesia Transfer of Care Note    Patient: Christie Odom    Procedures performed: Procedure(s):  LAPAROSCOPIC, VAGINAL HYSTERECTOMY - BSO  CYSTOSCOPY    Anesthesia type: General ETT    Patient location:Gyn PACU    Last vitals:   Vitals:    01/14/18 1149   BP: 141/67   Pulse: 81   Resp: 12   Temp: 36.5 C (97.7 F)   SpO2: 100%       Post pain: Patient not complaining of pain, continue current therapy      Mental Status:awake and alert     Respiratory Function: tolerating face mask    Cardiovascular: stable    Nausea/Vomiting: patient not complaining of nausea or vomiting    Hydration Status: adequate    Post assessment: no apparent anesthetic complications, no reportable events and no evidence of recall    Signed by: Orlean Bradford Donald Memoli  01/14/18 11:50 AM

## 2018-01-14 NOTE — Anesthesia Postprocedure Evaluation (Signed)
Anesthesia Post Evaluation    Patient: Christie Odom    Procedure(s):  LAPAROSCOPIC, VAGINAL HYSTERECTOMY - BSO  CYSTOSCOPY    Anesthesia type: general    Last Vitals:   Vitals:    01/14/18 1220   BP:    Pulse:    Resp: 14   Temp:    SpO2:        Anesthesia Post Evaluation    Patient location during evaluation: PACU  Patient participation: complete - patient participated  Level of consciousness: awake and alert  Pain score: 0  Pain management: adequate  Airway patency: patent  Anesthetic complications: no  Cardiovascular status: acceptable  Respiratory status: acceptable  Hydration status: acceptable        Anesthesia Qualified Clinical Data Registry 2018    PACU Reintubation  Did the Patient have general anesthesia with intubation: Yes  Did the Patient require reintubation in the PACU?: No  Was this a planned exubation trial (documented in the medical record)?: No    PONV Adult  Is the patient aged 48 or older: Yes  Did the patient receive recieve a general anesthestic: Yes  Does the patient have 3 or more risk factors for PONV? No        PONV Pediatric  Is the patient aged 89-17? No            PACU Transfer Checklist Protocol  Was the patient transferred to the PACU at the conclusion of surgery? Yes  Was a checklist or transfer protocol used? Yes    ICU Transfer Checklist Protocol  Was the patient transferred to the ICU at the conclusion of surgery? No      Post-op Pain Assessment Prior to Anesthesia Care End  Age >=18 and assessed for pain in PACU: Yes  Pacu pain score <7/10: Yes      Perioperative Mortality  Perioperative mortality prior to Anesthesia end time: No    Perioperative Cardiac Arrest  Did the patient have an unanticipated intraoperative cardiac arrest between anesthesia start time and anesthesia end time? No    Unplanned Admission to ICU  Did the patient have an unplanned admission to the ICU (not initially anticipated at anesthesia start time)? No      Signed by: Marland Mcalpine, 01/14/2018 12:30  PM

## 2018-01-14 NOTE — Plan of Care (Signed)
Transferred to the room from PACU via stretcher, A&O x 4, ambulated to the bed from the stretcher with assistance. Oriented to her surroundings and POC discussed with pt. Pt reported understanding. IV infusing well, foley in place, peri care done. lap sites x 3 CDI. k pad applied to abd for comfort. SCD on bilateral LE. Call light in reach. Informed to call for assistance when ready to get up to prevent from any injuries. Will cont to monitor pain level and assist with ambulating in halls.

## 2018-01-14 NOTE — Op Note (Signed)
FULL OPERATIVE NOTE    Date Time: 01/14/18 12:05 PM  Patient Name: Christie Odom  Attending Physician: Kimber Relic, MD      Date of Operation:   01/14/2018    Providers Performing:   Surgeon(s):  Madison Hickman Epimenio Sarin, MD    Circulator: Satira Sark, RN  Relief Circulator: Ailene Ards, RN  Relief Scrub: Carmie End; Darra Lis  Scrub Person: Erroll Luna  First Assistant: Jimmy Picket, SA  Second Circulator: Langston Reusing, RN    Operative Procedure:   Procedure(s):  LAPAROSCOPIC, VAGINAL HYSTERECTOMY - BSO  CYSTOSCOPY    Preoperative Diagnosis:   Pre-Op Diagnosis Codes:     * Uterine leiomyoma, unspecified location [D25.9]     * Abnormal uterine bleeding (AUB) [N93.9]    Postoperative Diagnosis:   Post-Op Diagnosis Codes:     * Uterine leiomyoma, unspecified location [D25.9]     * Abnormal uterine bleeding (AUB) [N93.9]    Indications:   Fibroid Uterus  AUB    Operative Notes:   Following the standard preoperative pause, general anesthesia was induced and the patient was placed in the dorsal lithotomy position. The abdomen, perineum, and vagina were prepped and draped in the usual fashion. A foley catheter inserted into the bladder and attached to straight drainage. After the initial preparation, the procedure commenced at the vagina.     A speculum was put in place to visualize the cervix, which was dilated to accommodate the v-care uterine manipulator.  The v-care manipulator was carefully inserted in order to avoid perforation.  Once the v-care manipulator was put in place, attention was then turned to the abdomen.     Following infiltration with Marcaine, a suppra-umbilical incision was made.  Under direct visualization, a 5mm trocar was introduced.  Once in the peritoneal cavity, the abdomen was insufflated with CO2 gas, and an opening pressure of 7 mmHg was noted.  Subsequently a pneumoperitoneum of 15 mmHg was created.  Visualization of the peritoneal cavity was then obtained  and a brief inspection did not reveal any signs of complications from entry. Under direct observation, 5mm flank ports were then placed laterally on both the right and left sides taking care to respect anatomical landmarks and vessels. Once the placement of the ports was complete, the actual laparoscopic procedure began.  A large fibroid uterus was noted.     Beginning on the right side and distally along the length of the fallopean tube, the mesosalpinx was exposed by lifting the tube up towards the anterior abdominal wall. The mesosalpinx was then sequentially, clamped, ligated, and cut using the ligasure device working alongside the length of the tube and towards the cornua.  The IP ligament was also clamped, ligated and cut using the ligasure device Once the level of the cornua, attention was then turned to the other side. The same process was repeated on the left, sequentially clamping, ligating, and cutting the mesosalpinx and IP ligament. Following this, the anterior leaf of the broad ligament was then taken down on the left side, dissecting down towards the peritoneal reflection at the base of the bladder and adjacent to the cervix. The same process was then repeated on the right side such that both sides met and the anterior leaflet had been appropriately skeletonized.     To ensure excellent hemostasis prior to further manipulation, the pedicles of the cardinal ligament was then ligated and divided on each side using the ligasure device.  We continued to clamp, ligate  and cut all pedicles.  Careful consideration was given when ligating the uterine arteries, and good hemostasis was noted.   Once all pedicles, including the uterosacral ligaments, were clamped ligated and cut, attention was turned to the cervico-vaginal reflection.  Using unipolar scissors, a colpotomy was made both anteriorly and posteriorly.  A bipolar device was used to maintain hemostasis.  Once the Uterus was fully detached from the  vagina, it was delivered transvaginally by morcellation with scalpel under direct visualization. The Uterus, fibroid, ovaries and fallopian tubes were then sent to pathology for analysis.       Attention was then turned to the vaginal aspect of the surgery. The foley catheter was removed. A weighted speculum wa placed in the posterior aspect of the vagina.  The vaginal vault was then oversewn with figure 8 nonlocking vicryl suture, securing first the left lateral  edge of the cuff to the uterosacral ligaments, followed by the right lateral edge. Good hemostasis was obtained.  Once these had been tied off, all sutures were trimmed.  The vagina was then irrigated with betadine.  A wet sponge was placed in the vagina to pack it off while attention was again turned back to the abdomen. All instruments were removed from the vagina at this time.     Using the laparoscopic irrigation device, the abdomen was carefully irrigated and inspected to ensure complete hemostasis. Bipolar cautery was used on the pedicles to ensure they were hemostatic and secure. Floseal was used to secure hemostasis.  Once the entire abdomen was inspected , the water was suctioned and the instruments carefully removed. The ports were then removed under direct visualization being sure to note hemostasis of the port sites on removal. The incisions were then cloed with interrupted monocryl sutures. Estimated blood loss was less than 60 mL.     Due to the large size of the uterus, a diagnostic cystoscopy was performed to assure patency of ureters.  No abnormalities were noted.    The patient tolerated the procedure well, anesthesis reveresed, and the patient was taken to the recovery room in stable condition. All sponges, instruments, and sharps were counted and correct.      Estimated Blood Loss:   60 mL    Implants:   * No implants in log *    Drains:   Drains: no    Specimens:   * No specimens in log *     SPECIMENS (last 24 hours)      Pathology  Specimens     Row Name 01/14/18 1000                Additional Information    Send final report to: Luzmaria Devaux, J          Specimen Information    Specimen Testing Required Routine Pathology       Specimen ID  [id number]       Specimen Description uterus, cervix, bilateral tubes and ovaries            Complications:   None      Signed by: Kimber Relic, MD

## 2018-01-14 NOTE — Anesthesia Preprocedure Evaluation (Signed)
Anesthesia Evaluation    AIRWAY    Mallampati: II    TM distance: >3 FB  Neck ROM: full  Mouth Opening:full  Planned to use difficult airway equipment: No CARDIOVASCULAR    cardiovascular exam normal       DENTAL    no notable dental hx     PULMONARY    pulmonary exam normal     OTHER FINDINGS                  Relevant Problems   No relevant active problems               Anesthesia Plan    ASA 2     general                     intravenous induction   Detailed anesthesia plan: general endotracheal        Post op pain management: per surgeon and PO analgesics    informed consent obtained      pertinent labs reviewed             Signed by: Marland Mcalpine 01/14/18 8:25 AM

## 2018-01-15 DIAGNOSIS — D259 Leiomyoma of uterus, unspecified: Secondary | ICD-10-CM | POA: Diagnosis present

## 2018-01-15 LAB — HEMOGLOBIN AND HEMATOCRIT, BLOOD
Hematocrit: 38 % (ref 37.0–47.0)
Hgb: 11.9 g/dL — ABNORMAL LOW (ref 12.0–16.0)

## 2018-01-15 MED ORDER — IBUPROFEN 600 MG PO TABS
600.00 mg | ORAL_TABLET | Freq: Four times a day (QID) | ORAL | 0 refills | Status: AC | PRN
Start: 2018-01-15 — End: ?

## 2018-01-15 MED ORDER — OXYCODONE-ACETAMINOPHEN 5-325 MG PO TABS
1.0000 | ORAL_TABLET | ORAL | 0 refills | Status: AC | PRN
Start: 2018-01-15 — End: ?

## 2018-01-15 NOTE — Progress Notes (Signed)
Patient discharged home with family and transported via wheelchair. Patient received discharge education and understood their post-up care and when to follow up with Dr. Madison Hickman. Patient verbalized understanding, received medications to bedside. IV catheter removed. All needs addressed before discharge.

## 2018-01-15 NOTE — Plan of Care (Signed)
Problem: Safety  Goal: Patient will be free from injury during hospitalization  Outcome: Progressing   01/15/18 1355   Goal/Interventions addressed this shift   Patient will be free from injury during hospitalization  Assess patient's risk for falls and implement fall prevention plan of care per policy;Use appropriate transfer methods;Provide and maintain safe environment;Ensure appropriate safety devices are available at the bedside;Hourly rounding;Include patient/ family/ care giver in decisions related to safety       Problem: Pain  Goal: Pain at adequate level as identified by patient  Outcome: Progressing   01/15/18 1355   Goal/Interventions addressed this shift   Pain at adequate level as identified by patient Identify patient comfort function goal;Assess for risk of opioid induced respiratory depression, including snoring/sleep apnea. Alert healthcare team of risk factors identified.;Assess pain on admission, during daily assessment and/or before any "as needed" intervention(s);Reassess pain within 30-60 minutes of any procedure/intervention, per Pain Assessment, Intervention, Reassessment (AIR) Cycle;Evaluate if patient comfort function goal is met;Evaluate patient's satisfaction with pain management progress;Offer non-pharmacological pain management interventions       Problem: Side Effects from Pain Analgesia  Goal: Patient will experience minimal side effects of analgesic therapy  Outcome: Progressing      Problem: Discharge Barriers  Goal: Patient will be discharged home or other facility with appropriate resources  Outcome: Progressing      Comments: AOx4, VSS. Pain controlled with scheduled ibuprofen and PRN percocet. Tolerating diet no n/v this shift.   Lap sites CDI, scant vaginal bleeding noted on peri-pad. Foley catheter removed this AM, pt DTV by 1630.  OOB independently. IV SL. Fall precautions in place. Pt to be d/c today. Updated pt on POC. Will cont to monitor   and purposefully round.

## 2018-01-15 NOTE — Plan of Care (Signed)
Problem: Safety  Goal: Patient will be free from injury during hospitalization  Outcome: Progressing   01/15/18 0357   Goal/Interventions addressed this shift   Patient will be free from injury during hospitalization  Assess patient's risk for falls and implement fall prevention plan of care per policy;Provide and maintain safe environment;Use appropriate transfer methods;Ensure appropriate safety devices are available at the bedside;Include patient/ family/ care giver in decisions related to safety;Hourly rounding     Patient alert, oriented x4, MAE x4 with steady gait. Side rails up x2. Call bell within reach. Hourly rounding done.  Goal: Patient will be free from infection during hospitalization  Outcome: Progressing   01/15/18 0357   Goal/Interventions addressed this shift   Free from Infection during hospitalization Monitor lab/diagnostic results;Assess and monitor for signs and symptoms of infection     Foley care done. VS WNL.    Problem: Pain  Goal: Pain at adequate level as identified by patient  Outcome: Progressing   01/15/18 0357   Goal/Interventions addressed this shift   Pain at adequate level as identified by patient Identify patient comfort function goal;Assess pain on admission, during daily assessment and/or before any "as needed" intervention(s);Assess for risk of opioid induced respiratory depression, including snoring/sleep apnea. Alert healthcare team of risk factors identified.;Reassess pain within 30-60 minutes of any procedure/intervention, per Pain Assessment, Intervention, Reassessment (AIR) Cycle;Evaluate if patient comfort function goal is met;Evaluate patient's satisfaction with pain management progress     Patient on scheduled toradol IV. Pain is well controlled.    Problem: Side Effects from Pain Analgesia  Goal: Patient will experience minimal side effects of analgesic therapy  Outcome: Progressing   01/15/18 0357   Goal/Interventions addressed this shift   Patient will experience  minimal side effects of analgesic therapy Monitor/assess patient's respiratory status (RR depth, effort, breath sounds);Assess for changes in cognitive function     No side effects from pain medications noted.    Problem: Discharge Barriers  Goal: Patient will be discharged home or other facility with appropriate resources  Outcome: Progressing   01/15/18 0357   Goal/Interventions addressed this shift   Discharge to home or other facility with appropriate resources Provide appropriate patient education     Possible discharge this am.    Problem: Psychosocial and Spiritual Needs  Goal: Demonstrates ability to cope with hospitalization/illness  Outcome: Progressing   01/15/18 0357   Goal/Interventions addressed this shift   Demonstrates ability to cope with hospitalizations/illness Encourage verbalization of feelings/concerns/expectations;Provide quiet environment     Understand the plan of care.

## 2018-01-15 NOTE — Discharge Instr - AVS First Page (Signed)
Reason for your Hospital Admission:  Laparoscopic, Vaginal Hysterectomy      Instructions for after your discharge:    Instrucciones de alta para la histerectoma laparoscpica  A usted se le practic una histerectoma laparoscpica. El mdico le hizo una pequea incisin en el abdomen para extraerle el tero. Este procedimiento puede [employer], tales como dolor o sangrado intensos. Por lo general, la recuperacin de la histerectoma laparoscpica tarda1-4semanas. Pero recuerde que el perodo de recuperacin vara de Neomia Dear mujer a [place].  Cuidados en la casa   No se alarme si siente una tirantez temporal en los hombros o rigidez en el cuello durante24-48horas despus de la ciruga.   Contine con los ejercicios de tos y de respiracin profunda que aprendi en el hospital.   Evite el estreimiento.   Coma frutas, verduras y granos integrales.   Tome 6-8 vasos de Regulatory affairs officer, a menos que le hayan dado [employer].   Utilice un laxante o un ablandador fecal suave si su mdico le indica que puede [place].   Dchese como de costumbre. Lvese cuidadosamente las incisiones con jabn suave y [place]. Squeselas con palmaditas.   No se aplique aceites, talcos o lociones a las incisiones.   No introduzca nada en su vagina hasta que su mdico le diga que puede [place]. No use tampones higinicos ni duchas vaginales. No tenga relaciones sexuales.   Si le han quitado los ovarios, informe a su mdico si tiene sofocos, cambios de humor o irritabilidad. Tal vez haya algn medicamento que le ayude.  Actividad   Pregntele a su mdico cundo puede volver a Counselling psychologist. Por lo general, puede manejar cuando ya no sienta dolor y pueda trasladarse cmodamente de un lado a otro. No maneje mientras se encuentra tomando medicamentos narcticos Equities trader.   Pida ayuda con las tareas domsticas y las diligencias durante su perodo de recuperacin.   No levante objetos que pesen ms  de10libras durante4semanas.   No pase la aspiradora ni haga otras actividades intensas hasta que su mdico le diga que puede [place].   Camine con la frecuencia en que pueda hacerlo.   Suba las escaleras lentamente y descanse cada pocos pasos.  Visitas de control  Programe una visita de control segn le indique el personal mdico.  Cundo debe llamar a su mdico  Llame a su mdico de inmediato si nota que tiene cualquiera de estos sntomas:   Fiebre de ms de101.69F   Sangrado vaginal de color rojo intenso, o sangrado vaginal en el que debe utilizar ms de una toalla higinica por hora   Flujo vaginal maloliente   Dificultad o ardor al Optometrist o hinchazn intensos en el abdomen   Enrojecimiento, hinchazn o secrecin del sitio de la incisin   Falta de aliento (dificultad para respirar) o dolor en el pecho   Date Last Reviewed: 04/17/2014   2000-2018 [employer]. [address], [place], [place] [address]. Todos los derechos reservados. Esta informacin no pretende sustituir la atencin Actor. Slo su mdico puede diagnosticar y tratar un problema de salud.

## 2018-01-15 NOTE — UM Notes (Signed)
01/14/18 1302    Place in Outpatient/Ambulatory Status      Per Dr Madison Hickman, pt is "47 y.o. female who presents to the hospital with   C/o that she started bleeding heavily 5 yeas ago and started having heavy blood clots    Pt had D&C which helped for a little    Periods are 8 days long    at its its worst pt changes pads every hour.    Pt has been taking OCPs with some satisfaction    Pt now has severe pain and has noted uterus has brown like she is pregnant.     Pt wants definitive treatement.    pt does not have premenopause symptoms"    Initial VS:  97.7, 69, 16,  100% sats,  128/69      Operative Procedure:   Procedure(s):  LAPAROSCOPIC, VAGINAL HYSTERECTOMY - BSO  CYSTOSCOPY    Preoperative Diagnosis:   Pre-Op Diagnosis Codes:     * Uterine leiomyoma, unspecified location [D25.9]     * Abnormal uterine bleeding (AUB) [N93.9]    Postoperative Diagnosis:   Post-Op Diagnosis Codes:     * Uterine leiomyoma, unspecified location [D25.9]     * Abnormal uterine bleeding (AUB) [N93.9]

## 2018-01-17 ENCOUNTER — Encounter (HOSPITAL_BASED_OUTPATIENT_CLINIC_OR_DEPARTMENT_OTHER): Payer: Self-pay | Admitting: Obstetrics & Gynecology

## 2021-10-03 ENCOUNTER — Other Ambulatory Visit: Payer: Self-pay | Admitting: Family
